# Patient Record
Sex: Male | Born: 2000 | Hispanic: Yes | Marital: Single | Smoking: Never smoker
Health system: Southern US, Community
[De-identification: ages and names within clinical notes are randomized; demographics above are authoritative.]

## PROBLEM LIST (undated history)

## (undated) DIAGNOSIS — R0981 Nasal congestion: Secondary | ICD-10-CM

## (undated) DIAGNOSIS — L989 Disorder of the skin and subcutaneous tissue, unspecified: Secondary | ICD-10-CM

## (undated) DIAGNOSIS — K0889 Other specified disorders of teeth and supporting structures: Secondary | ICD-10-CM

## (undated) DIAGNOSIS — Z8619 Personal history of other infectious and parasitic diseases: Secondary | ICD-10-CM

## (undated) DIAGNOSIS — Z98811 Dental restoration status: Secondary | ICD-10-CM

## (undated) DIAGNOSIS — J302 Other seasonal allergic rhinitis: Secondary | ICD-10-CM

---
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
## 2020-03-27 ENCOUNTER — Other Ambulatory Visit: Payer: Self-pay

## 2020-03-27 DIAGNOSIS — J309 Allergic rhinitis, unspecified: Secondary | ICD-10-CM

## 2020-03-27 DIAGNOSIS — H1010 Acute atopic conjunctivitis, unspecified eye: Secondary | ICD-10-CM

## 2020-03-27 NOTE — Telephone Encounter (Signed)
CALL BACK NUMBER:  [phone]  MEDICATION(S): cetirizine (ZYRTEC) 10 MG tablet and fluticasone (FLONASE) 50 MCG/ACT nasal spray  PREFERRED PHARMACY: [id number] - [place], [place] - [address]  ARE YOU CURRENTLY COMPLETELY OUT OF THE MEDICATION? :  yes

## 2020-03-29 MED ORDER — FLUTICASONE PROPIONATE 50 MCG/ACT NA SUSP: 1.0000 | Freq: Every day | NASAL | 12 refills | Status: DC

## 2020-03-29 MED ORDER — CETIRIZINE HCL 10 MG PO TABS: 10.0000 mg | ORAL_TABLET | Freq: Every day | ORAL | 5 refills | Status: DC

## 2020-07-18 DIAGNOSIS — H538 Other visual disturbances: Secondary | ICD-10-CM | POA: Diagnosis not present

## 2020-07-18 DIAGNOSIS — H52223 Regular astigmatism, bilateral: Secondary | ICD-10-CM | POA: Diagnosis not present

## 2020-07-18 DIAGNOSIS — H5213 Myopia, bilateral: Secondary | ICD-10-CM | POA: Diagnosis not present

## 2020-07-20 ENCOUNTER — Other Ambulatory Visit: Payer: Self-pay

## 2020-07-20 ENCOUNTER — Ambulatory Visit (INDEPENDENT_AMBULATORY_CARE_PROVIDER_SITE_OTHER): Payer: Medicaid Other | Admitting: Student

## 2020-07-20 ENCOUNTER — Encounter: Payer: Self-pay | Admitting: Student

## 2020-07-20 ENCOUNTER — Other Ambulatory Visit (HOSPITAL_COMMUNITY)
Admission: RE | Admit: 2020-07-20 | Discharge: 2020-07-20 | Disposition: A | Discharge status: Home / Self Care | Payer: Medicaid Other | Source: Ambulatory Visit | Attending: Pediatrics | Admitting: Pediatrics

## 2020-07-20 VITALS — BP 110/70 | Ht 64.17 in | Wt 140.0 lb

## 2020-07-20 DIAGNOSIS — Z113 Encounter for screening for infections with a predominantly sexual mode of transmission: Secondary | ICD-10-CM

## 2020-07-20 DIAGNOSIS — Z00121 Encounter for routine child health examination with abnormal findings: Secondary | ICD-10-CM

## 2020-07-20 DIAGNOSIS — Z68.41 Body mass index (BMI) pediatric, 5th percentile to less than 85th percentile for age: Secondary | ICD-10-CM

## 2020-07-20 DIAGNOSIS — L700 Acne vulgaris: Secondary | ICD-10-CM | POA: Diagnosis not present

## 2020-07-20 DIAGNOSIS — Z0101 Encounter for examination of eyes and vision with abnormal findings: Secondary | ICD-10-CM | POA: Diagnosis not present

## 2020-07-20 LAB — POCT RAPID HIV: Rapid HIV, POC: NEGATIVE

## 2020-07-20 NOTE — Progress Notes (Signed)
Adolescent Well Care Visit Dequarius D Montgomery Favor is a 19 y.o. male who is here for well care.    PCP:  Dillon Bjork, MD   History was provided by the patient.  Confidentiality was discussed with the patient and, if applicable, with caregiver as well. Patient's personal or confidential phone number: [phone]  Current Issues: Current concerns include none.   Nutrition: Nutrition/Eating Behaviors: eats a wide variety of meals  Adequate calcium in diet?: has some dairy regularly  Supplements/ Vitamins: no  Exercise/ Media: Play any Sports?/ Exercise: does not play for a team at the moment; plays soccer regularly with brother Screen Time:  > 2 hours  Sleep:  Sleep: ~8 hours at night; sleeps through the night  Social Screening: Lives with: mom, dad brother 8 Parental relations:  good Activities, Work, and Research officer, political party?: not currently working but does chores  Concerns regarding behavior with peers ?  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  no Secondhand smoke exposure?  no Drugs/ETOH?  no  Sexually Active?  no   Pregnancy Prevention: n/a  Safe at home, in school & in relationships?  Yes Safe to self?  Yes   Screenings: Patient has a dental home: yes  The patient completed the Rapid Assessment for Adolescent Preventive Services screening questionnaire and the following topics were identified as risk factors and discussed: exercise  In addition, the following topics were discussed as part of anticipatory guidance exercise.  PHQ-9 completed and results indicated no concerns  Physical Exam:  Vitals:   07/20/20 1543  BP: 110/70  Weight: 140 lb (63.5 kg)  Height: 5' 4.17" (1.63 m)   BP 110/70   Ht 5' 4.17" (1.63 m)   Wt 140 lb (63.5 kg)   BMI 23.90 kg/m  Body mass index: body mass index is 23.9 kg/m. Blood pressure percentiles are not available for patients who are  18 years or older.   Hearing Screening   125Hz  250Hz  500Hz  1000Hz  2000Hz  3000Hz  4000Hz  6000Hz  8000Hz   Right ear:   20 20 20  20     Left ear:   20 20 20  20       Visual Acuity Screening   Right eye Left eye Both eyes  Without correction: 20/50 20/50 20/50   With correction:     Comments: Went to eye doctor two days ago   General Appearance:   alert, oriented, no acute distress; well-appearing and pleasant   HENT: Normocephalic, no obvious abnormality, conjunctiva clear  Mouth:   Normal appearing teeth, no obvious discoloration, dental caries, or dental caps  Neck:   Supple; thyroid: no enlargement, symmetric, no tenderness/mass/nodules  Chest Normal male  Lungs:   Clear to auscultation bilaterally, normal work of breathing  Heart:   Regular rate and rhythm, S1 and S2 normal, no murmurs;   Abdomen:   Soft, non-tender, no mass, or organomegaly  Musculoskeletal:   Tone and strength strong and symmetrical, all extremities    Lymphatic:   No cervical adenopathy  Skin/Hair/Nails:   Skin warm, dry and intact; facial acne with deep scaring  Neurologic:   Strength, gait, and coordination normal and age-appropriate    Assessment and Plan:   Sadarius D Tobby Fawcett is healthy 19 y.o. male here for routine check up.  1. Encounter for well adolescent visit with abnormal findings - Hearing screening result:normal - Vision screening result: abnormal- being followed by eye  doctor  - Counseling provided for all of the vaccine components- UTD - Discussed transitioning care to internal/family med  2. Body mass index, pediatric, 5th percentile to less than 85th percentile for age - BMI is appropriate for age  66. Routine screening for STI (sexually transmitted infection) - POCT Rapid HIV - Urine cytology ancillary only  4. Acne vulgaris - Endorses compliance with acne routine: face washing & differin etc. - Hx of allergy to clinda - Discussed methods of proper skin care at length.  - Derm  referral for scarring & further management  - Ambulatory referral to Dermatology  Orders Placed This Encounter  Procedures  . Ambulatory referral to Dermatology  . POCT Rapid HIV   Return in 1 year for 19yo Antoine.  Ilina Xu, DO

## 2020-07-20 NOTE — Patient Instructions (Signed)

## 2020-07-24 DIAGNOSIS — L700 Acne vulgaris: Secondary | ICD-10-CM | POA: Insufficient documentation

## 2020-07-24 LAB — URINE CYTOLOGY ANCILLARY ONLY
Chlamydia: NEGATIVE
Comment: NEGATIVE
Comment: NORMAL
Neisseria Gonorrhea: NEGATIVE

## 2020-11-10 DIAGNOSIS — L700 Acne vulgaris: Secondary | ICD-10-CM | POA: Diagnosis not present

## 2020-11-10 DIAGNOSIS — Z5181 Encounter for therapeutic drug level monitoring: Secondary | ICD-10-CM | POA: Diagnosis not present

## 2020-12-05 DIAGNOSIS — L700 Acne vulgaris: Secondary | ICD-10-CM | POA: Diagnosis not present

## 2020-12-11 DIAGNOSIS — L700 Acne vulgaris: Secondary | ICD-10-CM | POA: Diagnosis not present

## 2020-12-11 DIAGNOSIS — Z5181 Encounter for therapeutic drug level monitoring: Secondary | ICD-10-CM | POA: Diagnosis not present

## 2021-02-16 DIAGNOSIS — L810 Postinflammatory hyperpigmentation: Secondary | ICD-10-CM | POA: Diagnosis not present

## 2021-02-16 DIAGNOSIS — Z5181 Encounter for therapeutic drug level monitoring: Secondary | ICD-10-CM | POA: Diagnosis not present

## 2021-02-16 DIAGNOSIS — L700 Acne vulgaris: Secondary | ICD-10-CM | POA: Diagnosis not present

## 2021-02-16 DIAGNOSIS — Z79899 Other long term (current) drug therapy: Secondary | ICD-10-CM | POA: Diagnosis not present

## 2021-03-15 ENCOUNTER — Encounter: Payer: Self-pay | Admitting: Pediatrics

## 2021-03-15 ENCOUNTER — Ambulatory Visit (INDEPENDENT_AMBULATORY_CARE_PROVIDER_SITE_OTHER): Payer: Medicaid Other | Admitting: Pediatrics

## 2021-03-15 ENCOUNTER — Other Ambulatory Visit: Payer: Self-pay

## 2021-03-15 DIAGNOSIS — J309 Allergic rhinitis, unspecified: Secondary | ICD-10-CM

## 2021-03-15 DIAGNOSIS — H1010 Acute atopic conjunctivitis, unspecified eye: Secondary | ICD-10-CM | POA: Diagnosis not present

## 2021-03-15 MED ORDER — MONTELUKAST SODIUM 10 MG PO TABS: 10.0000 mg | ORAL_TABLET | Freq: Every day | ORAL | 11 refills | Status: AC

## 2021-03-15 MED ORDER — FLUTICASONE PROPIONATE 50 MCG/ACT NA SUSP: 1.0000 | Freq: Every day | NASAL | 12 refills | Status: DC

## 2021-03-15 MED ORDER — CETIRIZINE HCL 10 MG PO TABS: 10.0000 mg | ORAL_TABLET | Freq: Every day | ORAL | 5 refills | Status: AC

## 2021-03-15 NOTE — Progress Notes (Signed)
  Subjective:    Clifford Gaines is a 20 y.o. old male here by himself for Follow-up .    HPI history of allergies with the pollen -  Needs refills on the medicines  Has used cetirizine and flonase in the past Wondering if there is something better  Father is taking montelukast and has had good effect  Review of Systems  Constitutional: Negative for activity change, appetite change and fever.  HENT: Negative for sinus pressure and trouble swallowing.   Gastrointestinal: Negative for diarrhea and vomiting.        Objective:    BP 118/70 (BP Location: Right Arm, Patient Position: Sitting)   Pulse 69   Temp (!) 97.2 F (36.2 C) (Temporal)   Ht 5' 3.11" (1.603 m)   Wt 145 lb 12.8 oz (66.1 kg)   SpO2 95%   BMI 25.74 kg/m  Physical Exam Constitutional:      Appearance: Normal appearance.  HENT:     Nose:     Comments: Boggy  Nasal turbinates    Mouth/Throat:     Mouth: Mucous membranes are moist.     Pharynx: Oropharynx is clear.  Cardiovascular:     Rate and Rhythm: Normal rate and regular rhythm.  Pulmonary:     Effort: Pulmonary effort is normal.     Breath sounds: Normal breath sounds.  Neurological:     Mental Status: He is alert.        Assessment and Plan:     Clifford Gaines was seen today for Follow-up .   Problem List Items Addressed This Visit   None   Visit Diagnoses    Allergic rhinoconjunctivitis       Relevant Medications   fluticasone (FLONASE) 50 MCG/ACT nasal spray   cetirizine (ZYRTEC) 10 MG tablet   montelukast (SINGULAIR) 10 MG tablet     Allergic rhinitis - refilled cetirizine and flonase. Add singulair per request. Use of medications reviewed.   PRN follow up  No follow-ups on file.  Royston Cowper, MD

## 2021-03-23 DIAGNOSIS — L810 Postinflammatory hyperpigmentation: Secondary | ICD-10-CM | POA: Diagnosis not present

## 2021-03-23 DIAGNOSIS — L730 Acne keloid: Secondary | ICD-10-CM | POA: Diagnosis not present

## 2021-03-23 DIAGNOSIS — L702 Acne varioliformis: Secondary | ICD-10-CM | POA: Diagnosis not present

## 2021-03-23 DIAGNOSIS — Z5181 Encounter for therapeutic drug level monitoring: Secondary | ICD-10-CM | POA: Diagnosis not present

## 2021-05-03 DIAGNOSIS — L700 Acne vulgaris: Secondary | ICD-10-CM | POA: Diagnosis not present

## 2021-05-03 DIAGNOSIS — Z5181 Encounter for therapeutic drug level monitoring: Secondary | ICD-10-CM | POA: Diagnosis not present

## 2021-05-07 ENCOUNTER — Encounter: Payer: Self-pay | Admitting: Pediatrics

## 2021-05-07 ENCOUNTER — Ambulatory Visit (INDEPENDENT_AMBULATORY_CARE_PROVIDER_SITE_OTHER): Payer: Medicaid Other | Admitting: Pediatrics

## 2021-05-07 ENCOUNTER — Encounter: Payer: Self-pay | Admitting: *Deleted

## 2021-05-07 VITALS — Temp 97.6°F | Wt 141.8 lb

## 2021-05-07 DIAGNOSIS — R509 Fever, unspecified: Secondary | ICD-10-CM | POA: Diagnosis not present

## 2021-05-07 LAB — POCT RAPID STREP A (OFFICE): Rapid Strep A Screen: NEGATIVE

## 2021-05-08 LAB — SARS-COV-2 RNA,(COVID-19) QUALITATIVE NAAT: SARS CoV2 RNA: NOT DETECTED

## 2021-05-09 LAB — CULTURE, GROUP A STREP
MICRO NUMBER:: 11894295
SPECIMEN QUALITY:: ADEQUATE

## 2021-05-09 NOTE — Progress Notes (Signed)
PCP: Dillon Bjork, MD   Chief Complaint  Patient presents with  . Fever    Tylenol last taken today around 1pm Symptoms started Friday  . Sore Throat  . Nausea  . Vomiting  . Cough  . Generalized Body Aches      Subjective:  HPI:  Clifford Gaines is a 20 y.o. male presenting with a sore throat, body aches, some nausea, cough.  Started 3 days ago. No mono exposures. No strep exposures.  Max T: unsure (did not take actual number)  Voiding: normal, trying to keep hydrated. Normal color of urination No diarrhea. No constipation.   Sick contacts: no   REVIEW OF SYSTEMS:  GENERAL: not toxic appearing ENT: no eye discharge, no external ear pain, no ear canal pain CV: No chest pain/tenderness PULM: no difficulty breathing or increased work of breathing  GI: no vomiting, diarrhea, constipation     Meds: Current Outpatient Medications  Medication Sig Dispense Refill  . ISOtretinoin (ACCUTANE) 40 MG capsule Take by mouth.    Marland Kitchen adapalene (DIFFERIN) 0.1 % cream Apply topically. (Patient not taking: Reported on 05/07/2021)    . cetirizine (ZYRTEC) 10 MG tablet Take 1 tablet (10 mg total) by mouth daily. (Patient not taking: Reported on 05/07/2021) 30 tablet 5  . fluticasone (FLONASE) 50 MCG/ACT nasal spray Place 1 spray into both nostrils daily. 1 spray in each nostril every day (Patient not taking: Reported on 05/07/2021) 16 g 12  . montelukast (SINGULAIR) 10 MG tablet Take 1 tablet (10 mg total) by mouth at bedtime. (Patient not taking: Reported on 05/07/2021) 30 tablet 11  . Olopatadine HCl 0.2 % SOLN Apply 1 drop to eye daily. (Patient not taking: Reported on 05/07/2021) 2.5 mL 5   No current facility-administered medications for this visit.    ALLERGIES:  Allergies  Allergen Reactions  . Clindamycin/Lincomycin     PMH:  Past Medical History:  Diagnosis Date  . Dental crown present   . History of scabies 10/15/2013  . Nasal congestion 12/28/2013   due to allergies,  per mother  . Scalp lesion 12/2013   right parietal scalp  . Seasonal allergies   . Tooth loose 12/28/2013    PSH:  Past Surgical History:  Procedure Laterality Date  . LESION EXCISION Right 12/30/2013   Procedure: EXCISION OF BENIGN RIGHT PARIETAL SCALP LESION;  Surgeon: Jerilynn Mages. Gerald Stabs, MD;  Location: [facility];  Service: Pediatrics;  Laterality: Right;  . resection of scalp lesion Right 12/28/13    Social history: no sick contacts  Family history: Family History  Problem Relation Age of Onset  . Kidney Stones Mother   . Anesthesia problems Mother        itching and "stinging" of skin after anesthesia  . Diabetes Maternal Grandmother   . Hypertension Maternal Grandmother   . Allergic rhinitis Neg Hx   . Angioedema Neg Hx   . Asthma Neg Hx   . Eczema Neg Hx   . Immunodeficiency Neg Hx   . Urticaria Neg Hx      Objective:   Physical Examination:  Temp: 97.6 F (36.4 C) (Temporal) Pulse:   BP:   (Blood pressure percentiles are not available for patients who are 18 years or older.)  Wt: 141 lb 12.8 oz (64.3 kg)  Ht:    BMI: Body mass index is 25.03 kg/m. (81 %ile (Z= 0.86) based on CDC (Boys, 2-20 Years) BMI-for-age based on BMI available as of 03/15/2021 from  contact on 03/15/2021.) GENERAL: Well appearing, no distress HEENT: NCAT, clear sclerae, TMs normal bilaterally, no nasal discharge,tonsillary erythema without exudate, no evidence of uvula deviation NECK: Supple, no cervical LAD LUNGS: EWOB, CTAB, no wheeze, no crackles CARDIO: RRR, normal S1S2 no murmur, well perfused ABDOMEN: Normoactive bowel sounds, soft, ND/NT, no masses or organomegaly EXTREMITIES: Warm and well perfused NEURO: CNII-XII intact SKIN: No rash, ecchymosis or petechiae     Assessment/Plan:   Clifford Gaines is a 20 y.o. old male here for sore throat, likely viral pharyngitis. POC strep negative, will send for culture. Also did send PCR for COVID which was negative. Discussed normal  course of illness and reasons to return which include the following: -inability to manage secretions (drooling) -dehydration (less than half normal number/quantity of urine) -improvement followed by acute worsening  Supportive care including: -Tylenol alternating with ibuprofen at appropriate dose for weight -Recommended ibuprofen with food.  -1 teaspoon honey with warm liquid to coat throat; CANNOT give <1yo.  If persists, would consider testing for mononucleosis but low suspicion at this point.    Follow up: PRN   Alma Friendly, MD  [facility]

## 2021-06-19 ENCOUNTER — Ambulatory Visit: Payer: Medicaid Other | Attending: Critical Care Medicine

## 2021-06-19 DIAGNOSIS — Z20822 Contact with and (suspected) exposure to covid-19: Secondary | ICD-10-CM | POA: Diagnosis not present

## 2021-06-20 LAB — SARS-COV-2, NAA 2 DAY TAT

## 2021-06-20 LAB — NOVEL CORONAVIRUS, NAA: SARS-CoV-2, NAA: DETECTED — AB

## 2021-08-24 ENCOUNTER — Ambulatory Visit
Admission: RE | Admit: 2021-08-24 | Discharge: 2021-08-24 | Disposition: A | Discharge status: Home / Self Care | Payer: Medicaid Other | Source: Ambulatory Visit | Attending: Pediatrics | Admitting: Pediatrics

## 2021-08-24 ENCOUNTER — Other Ambulatory Visit: Payer: Self-pay

## 2021-08-24 ENCOUNTER — Encounter: Payer: Self-pay | Admitting: Pediatrics

## 2021-08-24 ENCOUNTER — Ambulatory Visit (INDEPENDENT_AMBULATORY_CARE_PROVIDER_SITE_OTHER): Payer: Medicaid Other | Admitting: Pediatrics

## 2021-08-24 VITALS — BP 120/68 | Ht 65.0 in | Wt 141.5 lb

## 2021-08-24 DIAGNOSIS — M25571 Pain in right ankle and joints of right foot: Secondary | ICD-10-CM | POA: Diagnosis not present

## 2021-08-24 MED ORDER — IBUPROFEN 200 MG PO TABS: 400.0000 mg | ORAL_TABLET | Freq: Four times a day (QID) | ORAL | 0 refills | Status: AC | PRN

## 2021-08-24 NOTE — Progress Notes (Addendum)
   SUBJECTIVE:   CHIEF COMPLAINT / HPI:   Chief Complaint  Patient presents with   Foot Injury    A WEEK AGO PT JUMPED AND FELL ON FOOT AND PAIN HAS NOT GONE AWAY. PAIN LEVEL TODAY 7-8     Clifford Gaines is a 20 y.o. male here for right ankle pain. Jumped off a concrete platform at a house event where he was playing guitar. Wearing boots with heel when he landing with both feet onto the concrete ground. Denies rolling ankle. Felt like he hit the ground really hard. Pain started an hour later. Has posterior foot pain that does not radiate into his leg. No swelling. States the area is more red than usual. No previous injury to foot. Took nothing for pain. Not ice. Thought the pain was going away but now it is bothering him. Feels better when he is not wearing shoes. Pain 7/10 with walking and 3/10 with rest.    PERTINENT  PMH / PSH: reviewed and updated as appropriate   OBJECTIVE:   BP 120/68   Ht '5\' 5"'$  (1.651 m)   Wt 141 lb 8 oz (64.2 kg)   BMI 23.55 kg/m    GEN: well appearing male in no acute distress  CVS: well perfused  RESP: speaking in full sentences without pause, no respiratory distress  Right ankle: Inspection: No erythema, edema, ecchymosis or bony deformity, no bone pes planus or cavus deformity, transverse and medial arches intact Palpation: mild lateral calcaneal tenderness, no tenderness at base of 5th metatarsal  ROM: Full active and passive range of motion Strength: 5/5 in all directions No ligamentous laxity No pain at the base of the fifth metatarsal Able to ambulate with minimal pain on impact Special Tests: Negative Thompson test  -Neurovascularly intact, no instability noted   ASSESSMENT/PLAN:   Acute right ankle pain Pt is a 20 yo male who jumped from (<4 ft) from concrete platform with right ankle pain x1 week. Calcaneal tenderness on exam. Obtain xray. Discussed OTC pain control, ice, relative rest. Follow up with [facility] if not improved in  1-2 weeks. Pt agrees with plan.      Lyndee Hensen, DO PGY-3, [facility] 08/24/2021

## 2021-08-24 NOTE — Patient Instructions (Addendum)
It was great seeing you today! Your ankle is likely due to inflammation of the muscles and tendons in your heel. We will get an xray today to evaluate for fracture or dislocation.   As discusssed, take 400-600 mg Ibuprofen for pain up to 3 times a day as needed. Follow up with [facility]alie[facility] Ip (see info below) in 1-2 weeks if not improved. Be sure to rest your foot as discussed. Ice for 15 minutes 3-4 times a day.    [facility] Located in: [facility] Address: [address] [place], [place], [place] [address] Phone: [phone]    Take care,   [facility]

## 2021-08-24 NOTE — Assessment & Plan Note (Signed)
Pt is a 20 yo male who jumped from (<4 ft) from concrete platform with right ankle pain x1 week. Calcaneal tenderness on exam. Obtain xray. Discussed OTC pain control, ice, relative rest. Follow up with Ralie[facility] Ip if not improved in 1-2 weeks. Pt agrees with plan.

## 2021-09-15 IMAGING — CR DG ANKLE COMPLETE 3+V*R*
3 series · 3 of 3 positions shown · non-contrast
Comparison: None.

CLINICAL DATA: heel pain, ?5th MT pain after fall

EXAM:
RIGHT ANKLE - COMPLETE 3+ VIEW

[t ankle joint ap right]
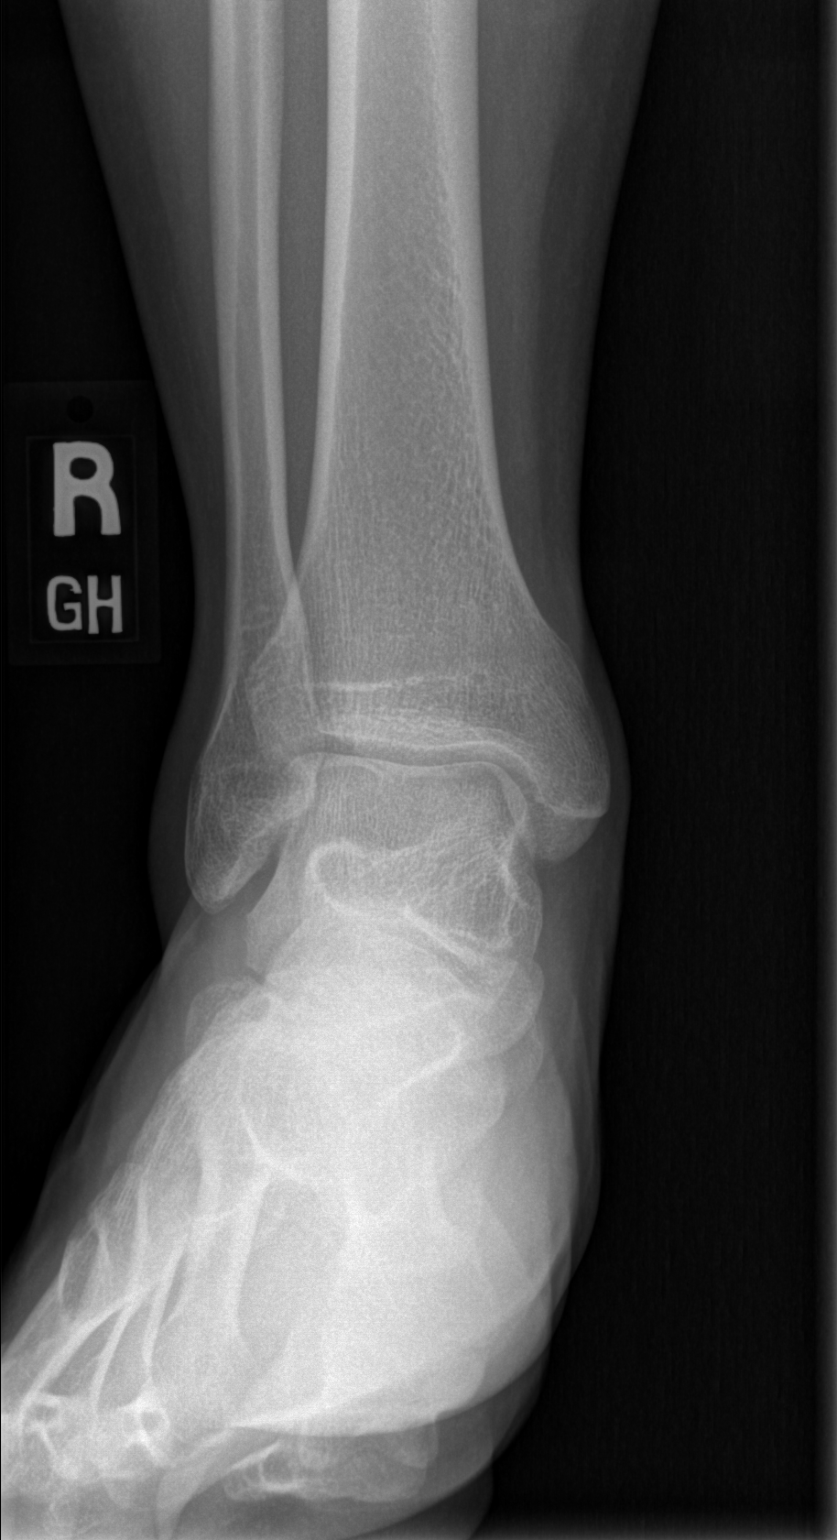

[t ankle joint oblique right]
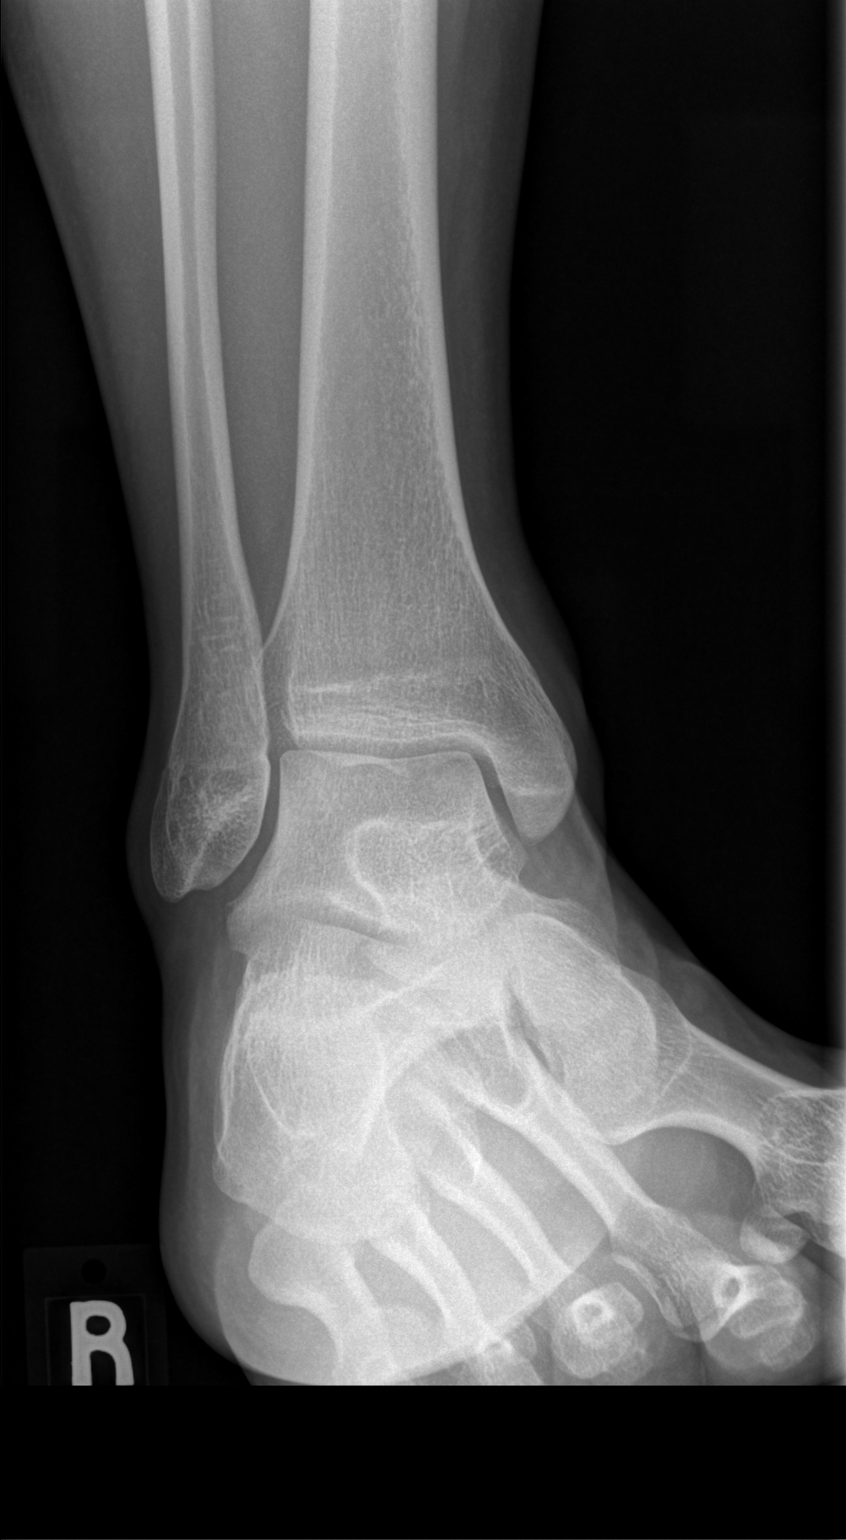

[t ankle joint lat right]
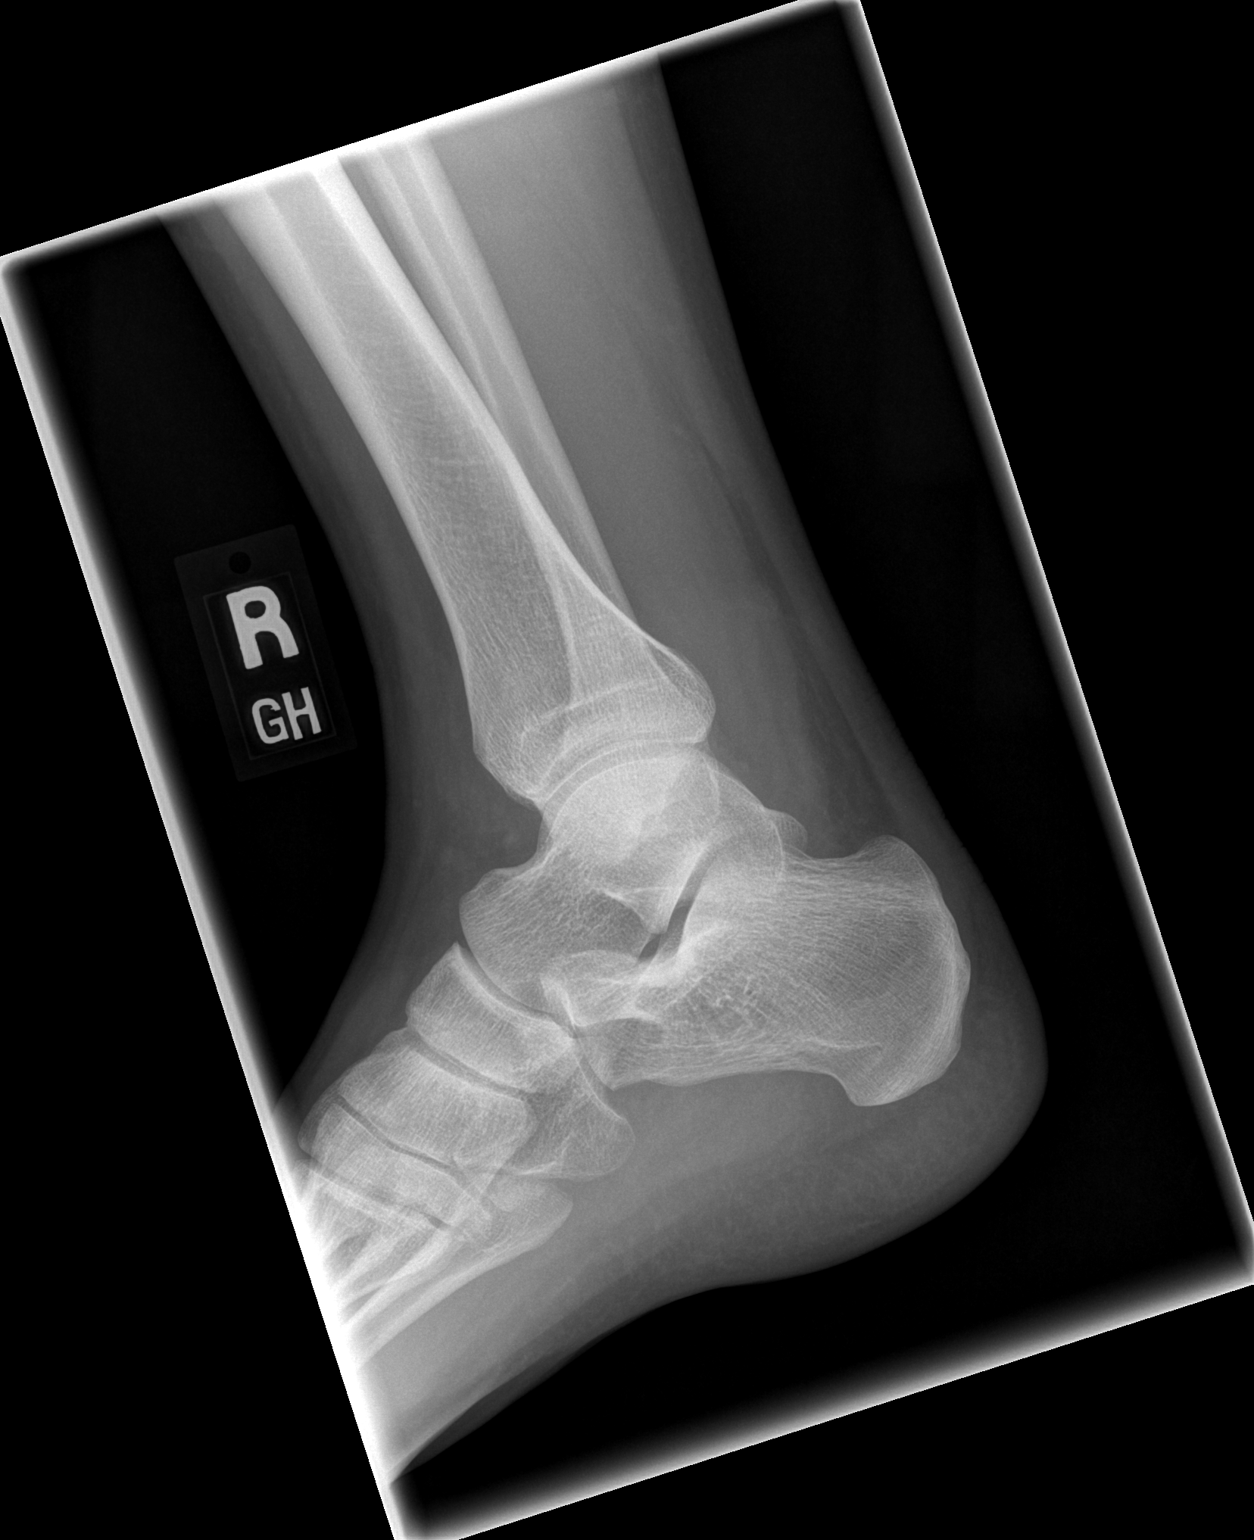

[3 of 3 positions shown; findings below may reference images not displayed]

FINDINGS: There is no evidence of acute fracture or dislocation. There is no
significant arthritis.
IMPRESSION: No acute osseous abnormality.

## 2022-01-21 ENCOUNTER — Ambulatory Visit (HOSPITAL_COMMUNITY)
Admission: EM | Admit: 2022-01-21 | Discharge: 2022-01-21 | Disposition: A | Discharge status: Home / Self Care | Payer: Medicaid Other | Attending: Physician Assistant | Admitting: Physician Assistant

## 2022-01-21 ENCOUNTER — Encounter (HOSPITAL_COMMUNITY): Payer: Self-pay | Admitting: *Deleted

## 2022-01-21 ENCOUNTER — Other Ambulatory Visit: Payer: Self-pay

## 2022-01-21 DIAGNOSIS — J069 Acute upper respiratory infection, unspecified: Secondary | ICD-10-CM | POA: Diagnosis not present

## 2022-01-21 DIAGNOSIS — J309 Allergic rhinitis, unspecified: Secondary | ICD-10-CM

## 2022-01-21 DIAGNOSIS — H1010 Acute atopic conjunctivitis, unspecified eye: Secondary | ICD-10-CM | POA: Diagnosis not present

## 2022-01-21 MED ORDER — FLUTICASONE PROPIONATE 50 MCG/ACT NA SUSP: 1.0000 | Freq: Every day | NASAL | 12 refills | Status: AC

## 2022-01-21 MED ORDER — PROMETHAZINE-DM 6.25-15 MG/5ML PO SYRP: 5.0000 mL | ORAL_SOLUTION | Freq: Four times a day (QID) | ORAL | 0 refills | Status: DC | PRN

## 2022-01-21 NOTE — Discharge Instructions (Signed)
Recommend Mucinex and Flonase Take cough syrup as needed for cough Can continue tylenol as needed Return if symptoms become worse

## 2022-01-21 NOTE — ED Provider Notes (Signed)
[place]    CSN: [id number] Arrival date & time: 01/21/22  1607      History   Chief Complaint Chief Complaint  Patient presents with   Fever   Sore Throat   Cough    HPI Clifford Gaines is a 21 y.o. male.   Pt complains of congestion, cough, fever that started four days ago.  He reports sore throat and postnasal drip as well.  He has been taking mucinex and tylenol with temporary relief.  He denies shortness of breath, wheezing, n/v/d.     Past Medical History:  Diagnosis Date   Dental crown present    History of scabies 10/15/2013   Nasal congestion 12/28/2013   due to allergies, per mother   Scalp lesion 12/2013   right parietal scalp   Seasonal allergies    Tooth loose 12/28/2013    Patient Active Problem List   Diagnosis Date Noted   Acute right ankle pain 08/24/2021   Acne vulgaris 07/24/2020   Influenza B 02/23/2019   Ingrown toenail 07/05/2018   Ingrown toenail of left foot with infection 06/17/2018   Wears glasses 07/05/2016   Skin tag 02/02/2016   Failed vision screen 06/06/2015   Allergic rhinitis 03/30/2015    Past Surgical History:  Procedure Laterality Date   LESION EXCISION Right 12/30/2013   Procedure: EXCISION OF BENIGN RIGHT PARIETAL SCALP LESION;  Surgeon: Jerilynn Mages. Gerald Stabs, MD;  Location: [facility];  Service: Pediatrics;  Laterality: Right;   resection of scalp lesion Right 12/28/13       Home Medications    Prior to Admission medications   Medication Sig Start Date End Date Taking? Authorizing Provider  promethazine-dextromethorphan (PROMETHAZINE-DM) 6.25-15 MG/5ML syrup Take 5 mLs by mouth 4 (four) times daily as needed for cough. 01/21/22  Yes Ward, Lenise Arena, PA-C  adapalene (DIFFERIN) 0.1 % cream Apply topically. Patient not taking: Reported on 05/07/2021 06/27/17   [provider]  cetirizine (ZYRTEC) 10 MG tablet Take 1 tablet (10 mg total) by mouth daily. Patient not taking: Reported on  05/07/2021 03/15/21   Dillon Bjork, MD  fluticasone [facility]) 50 MCG/ACT nasal spray Place 1 spray into both nostrils daily. 1 spray in each nostril every day 01/21/22   Ward, Lenise Arena, PA-C  ibuprofen (MOTRIN IB) 200 MG tablet Take 2 tablets (400 mg total) by mouth every 6 (six) hours as needed. 08/24/21   Lyndee Hensen, DO  ISOtretinoin (ACCUTANE) 40 MG capsule Take by mouth. 05/03/21   [provider]  montelukast (SINGULAIR) 10 MG tablet Take 1 tablet (10 mg total) by mouth at bedtime. Patient not taking: Reported on 05/07/2021 03/15/21   Dillon Bjork, MD  Olopatadine HCl 0.2 % SOLN Apply 1 drop to eye daily. Patient not taking: Reported on 05/07/2021 03/29/19   Rae Lips, MD    Family History Family History  Problem Relation Age of Onset   Kidney Stones Mother    Anesthesia problems Mother        itching and "stinging" of skin after anesthesia   Diabetes Maternal Grandmother    Hypertension Maternal Grandmother    Allergic rhinitis Neg Hx    Angioedema Neg Hx    Asthma Neg Hx    Eczema Neg Hx    Immunodeficiency Neg Hx    Urticaria Neg Hx     Social History Social History   Tobacco Use   Smoking status: Never   Smokeless tobacco: Never  Substance Use  Topics   Alcohol use: No    Alcohol/week: 0.0 standard drinks   Drug use: No     Allergies   Clindamycin/lincomycin   Review of Systems Review of Systems  Constitutional:  Positive for fever. Negative for chills.  HENT:  Positive for congestion and sore throat. Negative for ear pain.   Eyes:  Negative for pain and visual disturbance.  Respiratory:  Positive for cough. Negative for shortness of breath and wheezing.   Cardiovascular:  Negative for chest pain and palpitations.  Gastrointestinal:  Negative for abdominal pain and vomiting.  Genitourinary:  Negative for dysuria and hematuria.  Musculoskeletal:  Negative for arthralgias and back pain.  Skin:  Negative for color change and rash.   Neurological:  Negative for seizures and syncope.  All other systems reviewed and are negative.   Physical Exam Triage Vital Signs ED Triage Vitals  Enc Vitals Group     BP 01/21/22 1738 117/78     Pulse Rate 01/21/22 1738 83     Resp 01/21/22 1738 18     Temp 01/21/22 1738 98.3 F (36.8 C)     Temp src --      SpO2 01/21/22 1738 99 %     Weight --      Height --      Head Circumference --      Peak Flow --      Pain Score 01/21/22 1736 7     Pain Loc --      Pain Edu? --      Excl. in [place]? --    No data found.  Updated Vital Signs BP 117/78    Pulse 83    Temp 98.3 F (36.8 C)    Resp 18    SpO2 99%   Visual Acuity Right Eye Distance:   Left Eye Distance:   Bilateral Distance:    Right Eye Near:   Left Eye Near:    Bilateral Near:     Physical Exam Vitals and nursing note reviewed.  Constitutional:      General: He is not in acute distress.    Appearance: He is well-developed.  HENT:     Head: Normocephalic and atraumatic.     Mouth/Throat:     Pharynx: No pharyngeal swelling or posterior oropharyngeal erythema.     Comments: Postnasal drip Eyes:     Conjunctiva/sclera: Conjunctivae normal.  Cardiovascular:     Rate and Rhythm: Normal rate and regular rhythm.     Heart sounds: No murmur heard. Pulmonary:     Effort: Pulmonary effort is normal. No respiratory distress.     Breath sounds: Normal breath sounds.  Abdominal:     Palpations: Abdomen is soft.     Tenderness: There is no abdominal tenderness.  Musculoskeletal:        General: No swelling.     Cervical back: Neck supple.  Skin:    General: Skin is warm and dry.     Capillary Refill: Capillary refill takes less than 2 seconds.  Neurological:     Mental Status: He is alert.  Psychiatric:        Mood and Affect: Mood normal.     UC Treatments / Results  Labs (all labs ordered are listed, but only abnormal results are displayed) Labs Reviewed - No data to  display  EKG   Radiology No results found.  Procedures Procedures (including critical care time)  Medications Ordered in UC Medications - No data to  display  Initial Impression / Assessment and Plan / UC Course  I have reviewed the triage vital signs and the nursing notes.  Pertinent labs & imaging results that were available during my care of the patient were reviewed by me and considered in my medical decision making (see chart for details).     URI. Supportive care discussed.  Overall pt well appearing, vitals wnl, stable for discharge.  Return precautions discussed.  Final Clinical Impressions(s) / UC Diagnoses   Final diagnoses:  Viral upper respiratory tract infection     Discharge Instructions      Recommend Mucinex and Flonase Take cough syrup as needed for cough Can continue tylenol as needed Return if symptoms become worse      ED Prescriptions     Medication Sig Dispense Auth. Provider   fluticasone (FLONASE) 50 MCG/ACT nasal spray Place 1 spray into both nostrils daily. 1 spray in each nostril every day 16 g Ward, Lenise Arena, PA-C   promethazine-dextromethorphan (PROMETHAZINE-DM) 6.25-15 MG/5ML syrup Take 5 mLs by mouth 4 (four) times daily as needed for cough. 118 mL Ward, Lenise Arena, PA-C      PDMP not reviewed this encounter.   Ward, Lenise Arena, PA-C 01/21/22 1754

## 2022-01-21 NOTE — ED Triage Notes (Signed)
Pt reports for 4 days he has had a cough with lots of mucous,fever.

## 2022-07-18 ENCOUNTER — Other Ambulatory Visit: Payer: Self-pay

## 2022-07-29 ENCOUNTER — Ambulatory Visit (INDEPENDENT_AMBULATORY_CARE_PROVIDER_SITE_OTHER): Payer: Medicaid Other | Admitting: Pediatrics

## 2022-07-29 ENCOUNTER — Encounter: Payer: Self-pay | Admitting: Pediatrics

## 2022-07-29 DIAGNOSIS — M545 Low back pain, unspecified: Secondary | ICD-10-CM | POA: Diagnosis not present

## 2022-07-29 NOTE — Progress Notes (Unsigned)
  Subjective:    Clifford Gaines is a 21 y.o. old male here with his {family members:11419} for Back Pain (Feels it most when he is bending over to pick something up, or going from seated to standing. Middle lower back/Taking tylenol, but no relief. 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 Otherwise no falls, or accidents. [place] 2 weeks. ) .    Interpreter present: ***  HPI  He is carrying speakers, might have picked up the speakers wrong.  Since two weeks ago.  No falling, no accidents.      Patient Active Problem List   Diagnosis Date Noted   Acute right ankle pain 08/24/2021   Acne vulgaris 07/24/2020   Influenza B 02/23/2019   Ingrown toenail 07/05/2018   Ingrown toenail of left foot with infection 06/17/2018   Wears glasses 07/05/2016   Skin tag 02/02/2016   Failed vision screen 06/06/2015   Allergic rhinitis 03/30/2015    PE up to date?:***  History and Problem List: Clifford Gaines has Allergic rhinitis; Failed vision screen; Skin tag; Wears glasses; Ingrown toenail of left foot with infection; Ingrown toenail; Influenza B; Acne vulgaris; and Acute right ankle pain on their problem list.  Clifford Gaines  has a past medical history of Dental crown present, History of scabies (10/15/2013), Nasal congestion (12/28/2013), Scalp lesion (12/2013), Seasonal allergies, and Tooth loose (12/28/2013).  Immunizations needed: {NONE DEFAULTED:18576}     Objective:    There were no vitals taken for this visit.   General Appearance:   {PE GENERAL APPEARANCE:22457}  HENT: normocephalic, no obvious abnormality, conjunctiva clear. Left TM ***, Right TM ***  Mouth:   oropharynx moist, palate, tongue and gums normal; teeth ***  Neck:   supple, *** adenopathy  Lungs:   clear to auscultation bilaterally, even air movement . ***wheeze, ***crackles, ***tachypnea  Heart:   regular rate and regular rhythm, S1 and S2 normal, no murmurs   Abdomen:   soft, non-tender, normal bowel  sounds; no mass, or organomegaly  Musculoskeletal:   tone and strength strong and symmetrical, all extremities full range of motion           Skin/Hair/Nails:   skin warm and dry; no bruises, no rashes, no lesions        Assessment and Plan:     Clifford Gaines was seen today for Back Pain (Feels it most when he is bending over to pick something up, or going from seated to standing. Middle lower back/Taking tylenol, but no relief. 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 Otherwise no falls, or accidents. [place] 2 weeks. ) .   Problem List Items Addressed This Visit   None   Expectant management : importance of fluids and maintaining good hydration reviewed. Continue supportive care Return precautions reviewed. ***   No follow-ups on file.  Theodis Sato, MD

## 2022-07-29 NOTE — Patient Instructions (Addendum)
It was a pleasure taking care of you today!   If you have any questions about anything we've discussed today, please reach out to our office.     Dear Hal Neer,  As your medical provider, it is important to me that you continue to receive high-quality primary care services as you transition to adulthood.  After the age of 21, you can no longer be seen at the [facility] for your primary care health services.   Below is a list of adult medicine practices that are currently accepting new patients.  Please reach out to one of these practices to schedule a new patient appointment as soon as possible.  Please be aware that you will not be able to be seen at my office after your 22nd birthday.  Sincerely, Theodis Sato, MD  [facility]    Adult [place] [facility]  Address: [address], [place] [address]  Phone: [phone] Hours: Monday - Friday 9 AM -6 PM  Types of insurance accepted:  Commercial insurance [place] (orange card) [employer] Uninsured  Language services:  Video and phone interpreters available   Ages 69 and older    Adult primary care Onsite pharmacy Integrated behavioral health Financial assistance counseling Walk-in hours for established patients  Financial assistance counseling hours: Tuesdays 2:00PM - 5:00PM  Thursday 8:30AM - 4:30PM  Space is limited, 10 on Tuesday and 20 on Thursday on a first come, first serve basis  Name [place]  Address: [place], [place] [address]  Phone: [phone]  Hours: Monday - Friday 8:30 AM - 5 PM  Types of insurance accepted:  Pharmacist, community Medicaid Medicare Uninsured  Language services:  Video and phone  interpreters available   All ages - newborn to adult   Primary care for all ages (children and adults) Integrated behavioral health Nutritionist Financial assistance counseling   Name [place] on the ground floor of [facility]  Address: [address],  [place]  [address]  Phone: [phone]  Hours: Monday - Friday 8:15 AM - 5 PM  Types of insurance accepted:  Commercial insurance Medicaid Medicare Uninsured  Language services:  Video and phone interpreters available   Ages 75 and older   Adult primary care Nutritionist Certified Diabetes Educator  Integrated behavioral health Financial assistance counseling   Name [facility]  Address: [address] [place], [place] [address]  Phone: [phone]  Hours: Monday - Friday 8:30 AM - 5 PM    Types of insurance accepted:  Pharmacist, community Medicaid Medicare Uninsured  Language services:  Video and phone interpreters available   All ages - newborn to adult   Primary care for all ages (children and adults) Integrated behavioral health Financial assistance counseling

## 2022-08-14 ENCOUNTER — Ambulatory Visit: Payer: Medicaid Other

## 2022-08-23 ENCOUNTER — Ambulatory Visit: Payer: Medicaid Other | Attending: Pediatrics | Admitting: Physical Therapy

## 2022-08-23 DIAGNOSIS — M5459 Other low back pain: Secondary | ICD-10-CM | POA: Insufficient documentation

## 2022-08-23 DIAGNOSIS — M545 Low back pain, unspecified: Secondary | ICD-10-CM | POA: Insufficient documentation

## 2022-08-23 NOTE — Therapy (Signed)
OUTPATIENT PHYSICAL THERAPY THORACOLUMBAR EVALUATION   Patient Name: Clifford Gaines MRN: [id number] DOB:09/13/2001, 21 y.o., male Today's Date: 08/23/2022   PT End of Session - 08/23/22 1101     Visit Number 1    Number of Visits 4    Date for PT Re-Evaluation 09/20/22    Authorization Type MCD healthy blue    PT Start Time 1057    PT Stop Time 1136    PT Time Calculation (min) 39 min    Activity Tolerance Patient tolerated treatment well    Behavior During Therapy [facility] for tasks assessed/performed             Past Medical History:  Diagnosis Date   Dental crown present    History of scabies 10/15/2013   Nasal congestion 12/28/2013   due to allergies, per mother   Scalp lesion 12/2013   right parietal scalp   Seasonal allergies    Tooth loose 12/28/2013   Past Surgical History:  Procedure Laterality Date   LESION EXCISION Right 12/30/2013   Procedure: EXCISION OF BENIGN RIGHT PARIETAL SCALP LESION;  Surgeon: Jerilynn Mages. Gerald Stabs, MD;  Location: [place];  Service: Pediatrics;  Laterality: Right;   resection of scalp lesion Right 12/28/13   Patient Active Problem List   Diagnosis Date Noted   Acute right ankle pain 08/24/2021   Acne vulgaris 07/24/2020   Influenza B 02/23/2019   Ingrown toenail 07/05/2018   Ingrown toenail of left foot with infection 06/17/2018   Wears glasses 07/05/2016   Skin tag 02/02/2016   Failed vision screen 06/06/2015   Allergic rhinitis 03/30/2015    PCP: Dillon Bjork, MD   REFERRING PROVIDER: Theodis Sato MD   REFERRING DIAG: acute low back pain   Rationale for Evaluation and Treatment Rehabilitation  THERAPY DIAG:  Other low back pain  ONSET DATE: 1 month   SUBJECTIVE:                                                                                                                                                                                           SUBJECTIVE STATEMENT: The only thing I can think  of is that 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  About a month ago he felt a sudden onset of pain.  He did not have any locking up of his spine.  At the time, he had to sit and that really made it worse.  The pain has gotten better but its a dull discomfort when sitting or lifting, reaching down. The primary MD gave him some  exercises but he did not do those.  He denies any weakness, sensory changes.   PERTINENT HISTORY:    None relevant    PAIN:  Are you having pain? Yes: NPRS scale: discomfort 1/10 Pain location: bilateral lumbar central  Pain description: discomfort, soreness, awareness Aggravating factors: sitting  Relieving factors: walking, standing    PRECAUTIONS: None  WEIGHT BEARING RESTRICTIONS No  FALLS:  Has patient fallen in last 6 months? 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     OBJECTIVE:   DIAGNOSTIC FINDINGS:  None   PATIENT SURVEYS:  Modified Oswestry 4% (2/50)   SCREENING FOR RED FLAGS: Bowel or bladder incontinence: No Spinal tumors: No Cauda equina syndrome: No Compression fracture: No Abdominal aneurysm: No  COGNITION:  Overall cognitive status: Within functional limits for tasks assessed     SENSATION: WFL  MUSCLE LENGTH: Hamstrings: Right tigher than Lt Hip flexors and quads WFL Good hip ER/IR passively   POSTURE: No Significant postural limitations  PALPATION: TTP centrally L4-L5 and soreness along paraspinals   LUMBAR ROM:   Active  A/PROM  eval  Flexion Knees straight bends forward with fingertips to just below knees PAIN If he bends his knees WNL   Extension WNL pain relief   Right lateral flexion WNL   Left lateral flexion  WNL  Right rotation WNL  Left rotation WNL    (Blank rows = not tested)  LOWER EXTREMITY ROM:    LEs WNL  Active  Right eval Left eval  Hip flexion    Hip extension    Hip abduction    Hip adduction    Hip internal rotation    Hip external rotation    Knee flexion    Knee extension    Ankle dorsiflexion    Ankle plantarflexion    Ankle inversion    Ankle eversion     (Blank rows = not tested)  LOWER EXTREMITY MMT:    MMT Right eval Left eval  Hip flexion 5 5  Hip extension 5 5  Hip abduction    Hip adduction    Hip internal rotation    Hip external rotation    Knee flexion 5 5  Knee extension 5 5  Ankle dorsiflexion 5 5  Ankle plantarflexion    Ankle inversion    Ankle eversion     (Blank rows = not tested)  LUMBAR SPECIAL TESTS:  Straight leg raise test: Negative and Slump test: Negative  FUNCTIONAL TESTS:  SLS WNL Good squat form , min post pelvic tilt end of squat   GAIT: Distance walked: 150 Assistive device utilized: None Level of assistance: Complete [place] Comments: no deviations     TODAY'S TREATMENT  PT eval. HEP given Performed HEP and education     PATIENT EDUCATION:  Education details: POC, HEP, core, lifting , anatomy of spine, spine , seated posture  Person educated: Patient Education method: Explanation, Demonstration, and Handouts Education comprehension: verbalized understanding, verbal cues required, and needs further education   HOME EXERCISE PROGRAM: Access Code: [email or web address] URL: [email or web address] Date: 08/23/2022 Prepared by: Raeford Razor  Exercises - Prone Press Up  - 1-2 x daily - 7 x weekly - 1 sets - 10 reps -  15 hold - Supine 90/90 Abdominal Bracing  - 1 x daily - 7 x weekly - 1 sets - 5 reps - 30 hold - Supine 90/90 Alternating Heel Touches with Posterior Pelvic Tilt  - 1 x daily - 7 x weekly - 2 sets - 10 reps - 5 hold - Supine 90/90 with Leg Extensions  - 1 x daily - 7 x weekly - 2 sets  - 10 reps - 5 hold - Supine Dead Bug with Leg Extension  - 1 x daily - 7 x weekly - 2 sets - 10 reps - 5 hold - Supine Bridge  - 1 x daily - 7 x weekly - 2 sets - 10 reps - 5 hold  ASSESSMENT:  CLINICAL IMPRESSION: Patient is a 21 y.o. male who was seen today for physical therapy evaluation and treatment for lumbar strain .    OBJECTIVE IMPAIRMENTS decreased mobility, pain, and core weakness .   ACTIVITY LIMITATIONS carrying, lifting, bending, and sitting  PARTICIPATION LIMITATIONS: community activity, occupation, and recreation/hobby  PERSONAL FACTORS Age and Profession are also affecting patient's functional outcome.   REHAB POTENTIAL: Excellent  CLINICAL DECISION MAKING: Stable/uncomplicated  EVALUATION COMPLEXITY: Low   GOALS: Goals reviewed with patient? Yes  LONG TERM GOALS: Target date: 09/20/2022  Pt will be able to sit for class, driving > 1 hour without increased discomfort (from baseline) Baseline: pt is uncomfortable, shifts in seat often, especially > 2 hours  Goal status: INITIAL  2.  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  Pt will be I with HEP for core stability and strength without increasing back pain .  Baseline: given on eval  Goal status: INITIAL   PLAN: PT FREQUENCY: 1x/week  PT DURATION: 4 weeks  PLANNED INTERVENTIONS: Therapeutic exercises, Therapeutic activity, Neuromuscular re-education, Balance training, Gait training, Patient/Family education, Self Care, Joint mobilization, Manual therapy, and Re-evaluation.  PLAN FOR NEXT SESSION: check HEP , core. Sled, lifting   Check all possible CPT codes: 97164 - PT Re-evaluation, 97110- Therapeutic Exercise, 97140 - Manual Therapy, 97530 - Therapeutic Activities, and 97535 - Self Care     If treatment provided at initial evaluation, no treatment  charged due to lack of authorization.       Raeford Razor, PT 08/23/22 12:49 PM Phone: [phone] Fax: [phone]  Aniqua Briere, PT 08/23/2022, 12:40 PM

## 2022-09-05 NOTE — Therapy (Unsigned)
OUTPATIENT PHYSICAL THERAPY TREATMENT NOTE   Patient Name: Clifford Gaines MRN: [id number] DOB:[birth year], 21 y.o., male Today's Date: 09/06/2022  PCP: Mal Misty MD REFERRING PROVIDER: Mal Misty, MD   END OF SESSION:   PT End of Session - 09/06/22 1110     Visit Number 2    Number of Visits 4    Date for PT Re-Evaluation 09/20/22    Authorization Type MCD healthy blue    PT Start Time 1108    PT Stop Time 1142    PT Time Calculation (min) 34 min    Activity Tolerance Patient tolerated treatment well    Behavior During Therapy [facility] for tasks assessed/performed             Past Medical History:  Diagnosis Date   Dental crown present    History of scabies 10/15/2013   Nasal congestion 12/28/2013   due to allergies, per mother   Scalp lesion 12/2013   right parietal scalp   Seasonal allergies    Tooth loose 12/28/2013   Past Surgical History:  Procedure Laterality Date   LESION EXCISION Right 12/30/2013   Procedure: EXCISION OF BENIGN RIGHT PARIETAL SCALP LESION;  Surgeon: Jerilynn Mages. Gerald Stabs, MD;  Location: [facility];  Service: Pediatrics;  Laterality: Right;   resection of scalp lesion Right 12/28/13   Patient Active Problem List   Diagnosis Date Noted   Acute right ankle pain 08/24/2021   Acne vulgaris 07/24/2020   Influenza B 02/23/2019   Ingrown toenail 07/05/2018   Ingrown toenail of left foot with infection 06/17/2018   Wears glasses 07/05/2016   Skin tag 02/02/2016   Failed vision screen 06/06/2015   Allergic rhinitis 03/30/2015    REFERRING DIAG: M54.50 (ICD-10-CM) - Acute midline low back pain without sciatica   THERAPY DIAG:  Other low back pain  Rationale for Evaluation and Treatment Rehabilitation  PERTINENT HISTORY: none relevant   PRECAUTIONS: No   SUBJECTIVE: Pain has been better.  I did the exercises about 3 -4 times since beginning PT.  I really haven't had pain at all.   PAIN:  Are you having pain?  No   OBJECTIVE: (objective measures completed at initial evaluation unless otherwise dated) DIAGNOSTIC FINDINGS:  None    PATIENT SURVEYS:  Modified Oswestry 4% (2/50)    SCREENING FOR RED FLAGS: Bowel or bladder incontinence: No Spinal tumors: No Cauda equina syndrome: No Compression fracture: No Abdominal aneurysm: No   COGNITION:           Overall cognitive status: Within functional limits for tasks assessed                          SENSATION: WFL   MUSCLE LENGTH: Hamstrings: Right tigher than Lt Hip flexors and quads WFL Good hip ER/IR passively    POSTURE: No Significant postural limitations   PALPATION: TTP centrally L4-L5 and soreness along paraspinals    LUMBAR ROM:    Active  A/PROM  eval  Flexion Knees straight bends forward with fingertips to just below knees PAIN If he bends his knees WNL   Extension WNL pain relief   Right lateral flexion WNL   Left lateral flexion WNL  Right rotation WNL  Left rotation WNL    (Blank rows = not tested)   LOWER EXTREMITY ROM:    LEs WNL  Active  Right eval Left eval  Hip flexion      Hip  extension      Hip abduction      Hip adduction      Hip internal rotation      Hip external rotation      Knee flexion      Knee extension      Ankle dorsiflexion      Ankle plantarflexion      Ankle inversion      Ankle eversion       (Blank rows = not tested)   LOWER EXTREMITY MMT:     MMT Right eval Left eval  Hip flexion 5 5  Hip extension 5 5  Hip abduction      Hip adduction      Hip internal rotation      Hip external rotation      Knee flexion 5 5  Knee extension 5 5  Ankle dorsiflexion 5 5  Ankle plantarflexion      Ankle inversion      Ankle eversion       (Blank rows = not tested)   LUMBAR SPECIAL TESTS:  Straight leg raise test: Negative and Slump test: Negative   FUNCTIONAL TESTS:  SLS WNL Good squat form , min post pelvic tilt end of squat    GAIT: Distance walked: 150 Assistive  device utilized: None Level of assistance: Complete Independence Comments: no deviations         TODAY'S TREATMENT  PT eval. HEP given Performed HEP and education    [facility] Adult PT Treatment:                                                DATE: 09/06/22  Therapeutic Exercise:  Prone Press Up x 10 x 10  Supine 90/90 Abdominal Bracing  hand/leg pressing for 10 sec x 5  Supine 90/90 Alternating Heel Touches with Posterior Pelvic Tilt   x 10  Supine 90/90 with Leg Extensions  x 10  Supine Dead Bug with Leg Extension  x  10 Supine Bridge  - 1 x daily - 7 x weekly - 2 sets - 10 reps - 5 hold Plank 30 sec no pain increase  Child pose x 3 , laterals and forward  Bird dog  x 10  Squatting 25 lbs 2 x 10 , cued heavily at first , chair improved  45 lbs bar squats 25 lbs KB Dead lifts x 10   Self Care: Best exercises, recommend weight lifting    PATIENT EDUCATION:  Education details: POC, HEP, core, lifting , anatomy of spine, spine , seated posture  Person educated: Patient Education method: Explanation, Demonstration, and Handouts Education comprehension: verbalized understanding, verbal cues required, and needs further education     HOME EXERCISE PROGRAM: Access Code: [email or web address] URL: [email or web address] Date: 08/23/2022 Prepared by: Raeford Razor   ASSESSMENT:   CLINICAL IMPRESSION: Patient has noticed his pain has continued to improve and he no longer needs to attend therapy.  He is able to  Demonstrate independence with HEP and understands progression.  No pain with lifting, squatting.  Given info on lifting and form to reduce reinjury.    OBJECTIVE IMPAIRMENTS decreased mobility, pain, and core weakness .    ACTIVITY LIMITATIONS carrying, lifting, bending, and sitting   PARTICIPATION LIMITATIONS: community activity, occupation, and recreation/hobby   PERSONAL FACTORS Age and Profession are also affecting patient's functional outcome.  REHAB POTENTIAL:  Excellent   CLINICAL DECISION MAKING: Stable/uncomplicated   EVALUATION COMPLEXITY: Low     GOALS: Goals reviewed with patient? Yes   LONG TERM GOALS: Target date: 09/20/2022   Pt will be able to sit for class, driving > 1 hour without increased discomfort (from baseline) Baseline: pt is uncomfortable, shifts in seat often, especially > 2 hours  Goal status: MET   2.  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  Has not done this .    3.  Pt will be I with HEP for core stability and strength without increasing back pain .  Baseline: given on eval  Goal status: MET     PLAN: PT FREQUENCY: 1x/week   PT DURATION: 4 weeks   PLANNED INTERVENTIONS: Therapeutic exercises, Therapeutic activity, Neuromuscular re-education, Balance training, Gait training, Patient/Family education, Self Care, Joint mobilization, Manual therapy, and Re-evaluation.   PLAN FOR NEXT SESSION: Discharge     PAA,JENNIFER, PT 09/06/2022, 11:55 AM

## 2022-09-06 ENCOUNTER — Encounter: Payer: Self-pay | Admitting: Physical Therapy

## 2022-09-06 ENCOUNTER — Ambulatory Visit: Payer: Medicaid Other | Admitting: Physical Therapy

## 2022-09-06 DIAGNOSIS — M5459 Other low back pain: Secondary | ICD-10-CM

## 2022-09-13 ENCOUNTER — Ambulatory Visit: Payer: Medicaid Other | Admitting: Physical Therapy

## 2022-09-20 ENCOUNTER — Ambulatory Visit: Payer: Medicaid Other | Admitting: Physical Therapy

## 2022-10-01 ENCOUNTER — Ambulatory Visit: Payer: Medicaid Other | Admitting: Pediatrics

## 2022-12-08 ENCOUNTER — Encounter (HOSPITAL_COMMUNITY): Payer: Self-pay | Admitting: *Deleted

## 2022-12-08 ENCOUNTER — Ambulatory Visit (HOSPITAL_COMMUNITY)
Admission: EM | Admit: 2022-12-08 | Discharge: 2022-12-08 | Disposition: A | Discharge status: Home / Self Care | Payer: Medicaid Other | Attending: Physician Assistant | Admitting: Physician Assistant

## 2022-12-08 DIAGNOSIS — Z1152 Encounter for screening for COVID-19: Secondary | ICD-10-CM | POA: Insufficient documentation

## 2022-12-08 DIAGNOSIS — J069 Acute upper respiratory infection, unspecified: Secondary | ICD-10-CM | POA: Diagnosis not present

## 2022-12-08 LAB — RESP PANEL BY RT-PCR (FLU A&B, COVID) ARPGX2
Influenza A by PCR: NEGATIVE
Influenza B by PCR: NEGATIVE
SARS Coronavirus 2 by RT PCR: NEGATIVE

## 2022-12-08 LAB — POCT RAPID STREP A, ED / UC: Streptococcus, Group A Screen (Direct): NEGATIVE

## 2022-12-08 MED ORDER — PSEUDOEPH-BROMPHEN-DM 30-2-10 MG/5ML PO SYRP: 5.0000 mL | ORAL_SOLUTION | Freq: Four times a day (QID) | ORAL | 0 refills | Status: AC | PRN

## 2022-12-08 NOTE — ED Triage Notes (Signed)
Pt states he started coughing, congestion, sore throat, fever, body aches and vomiting on 12/02/2022 he has taken Ny Quil, IBU, Tylenol without relief.

## 2022-12-08 NOTE — ED Provider Notes (Signed)
[facility]    CSN: [id number] Arrival date & time: 12/08/22  1247      History   Chief Complaint Chief Complaint  Patient presents with   Cough   Nasal Congestion   Sore Throat   Emesis    HPI Clifford Gaines Clifford Gaines is a 21 y.o. male.   Patient complains of cough congestion, sore throat, body aches that started about 6 days ago.  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  Reports plan to make positive for strep throat.  He reports nausea related to postnasal drip.  Denies abdominal pain, diarrhea, wheezing, shortness of breath.  Reports he has been taking NyQuil Tylenol and ibuprofen with minimal relief.    Past Medical History:  Diagnosis Date   Dental crown present    History of scabies 10/15/2013   Nasal congestion 12/28/2013   due to allergies, per mother   Scalp lesion 12/2013   right parietal scalp   Seasonal allergies    Tooth loose 12/28/2013    Patient Active Problem List   Diagnosis Date Noted   Acute right ankle pain 08/24/2021   Acne vulgaris 07/24/2020   Influenza B 02/23/2019   Ingrown toenail 07/05/2018   Ingrown toenail of left foot with infection 06/17/2018   Wears glasses 07/05/2016   Skin tag 02/02/2016   Failed vision screen 06/06/2015   Allergic rhinitis 03/30/2015    Past Surgical History:  Procedure Laterality Date   LESION EXCISION Right 12/30/2013   Procedure: EXCISION OF BENIGN RIGHT PARIETAL SCALP LESION;  Surgeon: Jerilynn Mages. Gerald Stabs, MD;  Location: [facility];  Service: Pediatrics;  Laterality: Right;   resection of scalp lesion Right 12/28/13       Home Medications    Prior to Admission medications   Medication Sig Start Date End Date Taking? Authorizing Provider  brompheniramine-pseudoephedrine-DM 30-2-10 MG/5ML syrup Take 5 mLs by mouth 4 (four) times daily as needed. 12/08/22  Yes Ward, Lenise Arena, PA-C  adapalene (DIFFERIN) 0.1 % cream  Apply topically. Patient not taking: Reported on 05/07/2021 06/27/17   [provider]  cetirizine (ZYRTEC) 10 MG tablet Take 1 tablet (10 mg total) by mouth daily. 03/15/21   Dillon Bjork, MD  fluticasone (FLONASE) 50 MCG/ACT nasal spray Place 1 spray into both nostrils daily. 1 spray in each nostril every day 01/21/22   Ward, Lenise Arena, PA-C  ibuprofen (MOTRIN IB) 200 MG tablet Take 2 tablets (400 mg total) by mouth every 6 (six) hours as needed. Patient not taking: Reported on 08/23/2022 08/24/21   Lyndee Hensen, DO  ISOtretinoin (ACCUTANE) 40 MG capsule Take by mouth. Patient not taking: Reported on 08/23/2022 05/03/21   [provider]  montelukast (SINGULAIR) 10 MG tablet Take 1 tablet (10 mg total) by mouth at bedtime. Patient not taking: Reported on 05/07/2021 03/15/21   Dillon Bjork, MD  Olopatadine HCl 0.2 % SOLN Apply 1 drop to eye daily. Patient not taking: Reported on 05/07/2021 03/29/19   Rae Lips, MD    Family History Family History  Problem Relation Age of Onset   Kidney Stones Mother    Anesthesia problems Mother        itching and "stinging" of skin after anesthesia   Diabetes Maternal Grandmother    Hypertension Maternal Grandmother    Allergic rhinitis Neg Hx    Angioedema Neg Hx    Asthma Neg Hx  Eczema Neg Hx    Immunodeficiency Neg Hx    Urticaria Neg Hx     Social History Social History   Tobacco Use   Smoking status: Never   Smokeless tobacco: Never  Vaping Use   Vaping Use: Never used  Substance Use Topics   Alcohol use: No    Alcohol/week: 0.0 standard drinks of alcohol   Drug use: No     Allergies   Clindamycin/lincomycin   Review of Systems Review of Systems  Constitutional:  Positive for fever. Negative for chills.  HENT:  Positive for congestion and sore throat. Negative for ear pain.   Eyes:  Negative for pain and visual disturbance.  Respiratory:  Positive for cough. Negative for shortness of breath and wheezing.    Cardiovascular:  Negative for chest pain and palpitations.  Gastrointestinal:  Negative for abdominal pain and vomiting.  Genitourinary:  Negative for dysuria and hematuria.  Musculoskeletal:  Negative for arthralgias and back pain.  Skin:  Negative for color change and rash.  Neurological:  Negative for seizures and syncope.  All other systems reviewed and are negative.    Physical Exam Triage Vital Signs ED Triage Vitals  Enc Vitals Group     BP 12/08/22 1505 131/85     Pulse Rate 12/08/22 1505 98     Resp 12/08/22 1505 20     Temp 12/08/22 1505 99.9 F (37.7 C)     Temp Source 12/08/22 1505 Oral     SpO2 12/08/22 1505 97 %     Weight --      Height --      Head Circumference --      Peak Flow --      Pain Score 12/08/22 1503 8     Pain Loc --      Pain Edu? --      Excl. in [place]? --    No data found.  Updated Vital Signs BP 131/85 (BP Location: Left Arm)   Pulse 98   Temp 99.9 F (37.7 C) (Oral)   Resp 20   SpO2 97%   Visual Acuity Right Eye Distance:   Left Eye Distance:   Bilateral Distance:    Right Eye Near:   Left Eye Near:    Bilateral Near:     Physical Exam Vitals and nursing note reviewed.  Constitutional:      General: He is not in acute distress.    Appearance: He is well-developed.  HENT:     Head: Normocephalic and atraumatic.  Eyes:     Conjunctiva/sclera: Conjunctivae normal.  Cardiovascular:     Rate and Rhythm: Normal rate and regular rhythm.     Heart sounds: No murmur heard. Pulmonary:     Effort: Pulmonary effort is normal. No respiratory distress.     Breath sounds: Normal breath sounds.  Abdominal:     Palpations: Abdomen is soft.     Tenderness: There is no abdominal tenderness.  Musculoskeletal:        General: No swelling.     Cervical back: Neck supple.  Skin:    General: Skin is warm and dry.     Capillary Refill: Capillary refill takes less than 2 seconds.  Neurological:     Mental Status: He is alert.   Psychiatric:        Mood and Affect: Mood normal.      UC Treatments / Results  Labs (all labs ordered are listed, but only abnormal results are displayed)  Labs Reviewed  RESP PANEL BY RT-PCR (FLU A&B, COVID) [id number]  POCT RAPID STREP A, ED / UC    EKG   Radiology No results found.  Procedures Procedures (including critical care time)  Medications Ordered in UC Medications - No data to display  Initial Impression / Assessment and Plan / UC Course  I have reviewed the triage vital signs and the nursing notes.  Pertinent labs & imaging results that were available during my care of the patient were reviewed by me and considered in my medical decision making (see chart for details).    Upper respiratory infection.  COVID/flu test pending.  Lungs are clear, patient in no acute distress, stable for discharge with supportive care.  Cough syrup prescribed.  Return precautions discussed. Final Clinical Impressions(s) / UC Diagnoses   Final diagnoses:  Viral URI with cough     Discharge Instructions      Your strep test was negative.  Will call with COVID/Flu test results.  Recommend Mucinex and Flonase for congestion and postnasal drip. Can take prescribed cough syrup as needed for cough. Continue with Tylenol or ibuprofen as needed for fever and bodyaches. Drink plenty of fluids, rest. Return if your symptoms become worse.     ED Prescriptions     Medication Sig Dispense Auth. Provider   brompheniramine-pseudoephedrine-DM 30-2-10 MG/5ML syrup Take 5 mLs by mouth 4 (four) times daily as needed. 120 mL Ward, Lenise Arena, PA-C      PDMP not reviewed this encounter.   Ward, Lenise Arena, PA-C 12/08/22 1530

## 2022-12-08 NOTE — Discharge Instructions (Addendum)
Your strep test was negative.  Will call with COVID/Flu test results.  Recommend Mucinex and Flonase for congestion and postnasal drip. Can take prescribed cough syrup as needed for cough. Continue with Tylenol or ibuprofen as needed for fever and bodyaches. Drink plenty of fluids, rest. Return if your symptoms become worse.

## 2022-12-13 DIAGNOSIS — J40 Bronchitis, not specified as acute or chronic: Secondary | ICD-10-CM | POA: Diagnosis not present

## 2022-12-23 DIAGNOSIS — H8303 Labyrinthitis, bilateral: Secondary | ICD-10-CM | POA: Diagnosis not present

## 2023-01-23 ENCOUNTER — Emergency Department (HOSPITAL_BASED_OUTPATIENT_CLINIC_OR_DEPARTMENT_OTHER): Payer: Medicaid Other

## 2023-01-23 ENCOUNTER — Emergency Department (HOSPITAL_BASED_OUTPATIENT_CLINIC_OR_DEPARTMENT_OTHER)
Admission: EM | Admit: 2023-01-23 | Discharge: 2023-01-23 | Disposition: A | Discharge status: Home / Self Care | Payer: Medicaid Other | Attending: Emergency Medicine | Admitting: Emergency Medicine

## 2023-01-23 ENCOUNTER — Other Ambulatory Visit: Payer: Self-pay

## 2023-01-23 ENCOUNTER — Emergency Department (HOSPITAL_COMMUNITY): Payer: Medicaid Other

## 2023-01-23 ENCOUNTER — Encounter (HOSPITAL_BASED_OUTPATIENT_CLINIC_OR_DEPARTMENT_OTHER): Payer: Self-pay

## 2023-01-23 DIAGNOSIS — R109 Unspecified abdominal pain: Secondary | ICD-10-CM | POA: Diagnosis present

## 2023-01-23 DIAGNOSIS — N281 Cyst of kidney, acquired: Secondary | ICD-10-CM | POA: Diagnosis not present

## 2023-01-23 DIAGNOSIS — D72829 Elevated white blood cell count, unspecified: Secondary | ICD-10-CM | POA: Diagnosis not present

## 2023-01-23 DIAGNOSIS — K7689 Other specified diseases of liver: Secondary | ICD-10-CM | POA: Insufficient documentation

## 2023-01-23 DIAGNOSIS — N132 Hydronephrosis with renal and ureteral calculous obstruction: Secondary | ICD-10-CM | POA: Diagnosis not present

## 2023-01-23 DIAGNOSIS — R1010 Upper abdominal pain, unspecified: Secondary | ICD-10-CM | POA: Diagnosis not present

## 2023-01-23 DIAGNOSIS — K429 Umbilical hernia without obstruction or gangrene: Secondary | ICD-10-CM | POA: Diagnosis not present

## 2023-01-23 DIAGNOSIS — K760 Fatty (change of) liver, not elsewhere classified: Secondary | ICD-10-CM | POA: Diagnosis not present

## 2023-01-23 DIAGNOSIS — N200 Calculus of kidney: Secondary | ICD-10-CM

## 2023-01-23 LAB — URINALYSIS, ROUTINE W REFLEX MICROSCOPIC
Bilirubin Urine: NEGATIVE
Glucose, UA: NEGATIVE mg/dL
Ketones, ur: NEGATIVE mg/dL
Leukocytes,Ua: NEGATIVE
Nitrite: NEGATIVE
Specific Gravity, Urine: 1.03 (ref 1.005–1.030)
pH: 5.5 (ref 5.0–8.0)

## 2023-01-23 LAB — BASIC METABOLIC PANEL
Anion gap: 12 (ref 5–15)
BUN: 15 mg/dL (ref 6–20)
CO2: 22 mmol/L (ref 22–32)
Calcium: 10.6 mg/dL — ABNORMAL HIGH (ref 8.9–10.3)
Chloride: 105 mmol/L (ref 98–111)
Creatinine, Ser: 1.18 mg/dL (ref 0.61–1.24)
GFR, Estimated: >60 mL/min (ref >60)
Glucose, Bld: 108 mg/dL — ABNORMAL HIGH (ref 70–99)
Potassium: 4.2 mmol/L (ref 3.5–5.1)
Sodium: 139 mmol/L (ref 135–145)

## 2023-01-23 LAB — CBC
HCT: 43.8 % (ref 39.0–52.0)
Hemoglobin: 15.4 g/dL (ref 13.0–17.0)
MCH: 30 pg (ref 26.0–34.0)
MCHC: 35.2 g/dL (ref 30.0–36.0)
MCV: 85.4 fL (ref 80.0–100.0)
Platelets: 254 10*3/uL (ref 150–400)
RBC: 5.13 MIL/uL (ref 4.22–5.81)
RDW: 12.2 % (ref 11.5–15.5)
WBC: 13.4 10*3/uL — ABNORMAL HIGH (ref 4.0–10.5)
nRBC: 0 % (ref 0.0–0.2)

## 2023-01-23 MED ORDER — ONDANSETRON HCL 4 MG/2ML IJ SOLN
4.0000 mg | Freq: Once | INTRAMUSCULAR | Status: AC
  Administered 2023-01-23: 4 mg via INTRAVENOUS
  Filled 2023-01-23: qty 2

## 2023-01-23 MED ORDER — TAMSULOSIN HCL 0.4 MG PO CAPS: 0.4000 mg | ORAL_CAPSULE | Freq: Every day | ORAL | 0 refills | Status: AC

## 2023-01-23 MED ORDER — ONDANSETRON HCL 4 MG PO TABS: 4.0000 mg | ORAL_TABLET | Freq: Four times a day (QID) | ORAL | 0 refills | Status: AC | PRN

## 2023-01-23 MED ORDER — OXYCODONE-ACETAMINOPHEN 5-325 MG PO TABS: 1.0000 | ORAL_TABLET | Freq: Four times a day (QID) | ORAL | 0 refills | Status: AC | PRN

## 2023-01-23 MED ORDER — KETOROLAC TROMETHAMINE 15 MG/ML IJ SOLN
15.0000 mg | Freq: Once | INTRAMUSCULAR | Status: AC
  Administered 2023-01-23: 15 mg via INTRAVENOUS
  Filled 2023-01-23: qty 1

## 2023-01-23 MED ORDER — MORPHINE SULFATE (PF) 2 MG/ML IV SOLN
2.0000 mg | Freq: Once | INTRAVENOUS | Status: AC
  Administered 2023-01-23: 2 mg via INTRAVENOUS
  Filled 2023-01-23: qty 1

## 2023-01-23 NOTE — Discharge Instructions (Addendum)
You were seen in the emergency department and found to have a kidney stone.  Please take ibuprofen as needed for pain.  We are sending you home with multiple medications to assist with passing the stone and for residual pain/nausea:  -Flomax-this is a medication to help pass the stone, it allows urine to exit the body more freely.  Please take this once daily with a meal.  -Percocet-this is a narcotic/controlled substance medication that has potential addicting qualities.  We recommend that you take 1-2 tablets every 6 hours as needed for severe pain.  Do not drive or operate heavy machinery when taking this medicine as it can be sedating. Do not drink alcohol or take other sedating medications when taking this medicine for safety reasons.  Keep this out of reach of small children.  Please be aware this medicine has Tylenol in it (325 mg/tab) do not exceed the maximum dose of Tylenol in a day per over the counter recommendations should you decide to supplement with Tylenol over the counter.   -Zofran-this is an antinausea medication, you may take this every 8 hours as needed for nausea and vomiting, please allow the tablet to dissolve underneath of your tongue.   We have prescribed you new medication(s) today. Discuss the medications prescribed today with your pharmacist as they can have adverse effects and interactions with your other medicines including over the counter and prescribed medications. Seek medical evaluation if you start to experience new or abnormal symptoms after taking one of these medicines, seek care immediately if you start to experience difficulty breathing, feeling of your throat closing, facial swelling, or rash as these could be indications of a more serious allergic reaction  Please follow-up with the urology group provided in your discharge instructions within 3 to 5 days.  Return to the ER for new or worsening symptoms including but not limited to worsening pain not controlled  by these medicines, inability to keep fluids down, fever, or any other concerns that you may have.

## 2023-01-23 NOTE — ED Provider Notes (Signed)
[facility] Provider Note   CSN: [id number] Arrival date & time: 01/23/23  1357     History  Chief Complaint  Patient presents with   Flank Pain    Clifford Gaines is a 22 y.o. male with overall noncontributory past medical history who presents with concern for left flank pain that began this morning with radiation towards groin.  Patient reports he went to a student clinic, was told he had blood in his urine.  Patient reports strong family history of kidney stones with mom having had 5 already.  Patient reports some nausea in triage.  He has no significant other complaints other than pain.  He denies any personal previous history of kidney stones.  He denies any diarrhea, constipation, fever, chills.  He denies any penile discharge.   Flank Pain       Home Medications Prior to Admission medications   Medication Sig Start Date End Date Taking? Authorizing Provider  ondansetron (ZOFRAN) 4 MG tablet Take 1 tablet (4 mg total) by mouth every 6 (six) hours as needed for nausea or vomiting. 01/23/23  Yes Lorelee Mclaurin H, PA-C  oxyCODONE-acetaminophen (PERCOCET/ROXICET) 5-325 MG tablet Take 1 tablet by mouth every 6 (six) hours as needed for severe pain. 01/23/23  Yes Patrina Andreas H, PA-C  tamsulosin (FLOMAX) 0.4 MG CAPS capsule Take 1 capsule (0.4 mg total) by mouth daily. 01/23/23  Yes Ashok Sawaya H, PA-C  brompheniramine-pseudoephedrine-DM 30-2-10 MG/5ML syrup Take 5 mLs by mouth 4 (four) times daily as needed. 12/08/22   Ward, Lenise Arena, PA-C  cetirizine (ZYRTEC) 10 MG tablet Take 1 tablet (10 mg total) by mouth daily. 03/15/21   Dillon Bjork, MD  fluticasone (FLONASE) 50 MCG/ACT nasal spray Place 1 spray into both nostrils daily. 1 spray in each nostril every day 01/21/22   Ward, Lenise Arena, PA-C  ibuprofen (MOTRIN IB) 200 MG tablet Take 2 tablets (400 mg total) by mouth every 6 (six) hours as needed. Patient not taking:  Reported on 08/23/2022 08/24/21   Lyndee Hensen, DO  montelukast (SINGULAIR) 10 MG tablet Take 1 tablet (10 mg total) by mouth at bedtime. Patient not taking: Reported on 05/07/2021 03/15/21   Dillon Bjork, MD  Olopatadine HCl 0.2 % SOLN Apply 1 drop to eye daily. Patient not taking: Reported on 05/07/2021 03/29/19   Rae Lips, MD      Allergies    Clindamycin/lincomycin    Review of Systems   Review of Systems  Genitourinary:  Positive for flank pain.  All other systems reviewed and are negative.   Physical Exam Updated Vital Signs BP 133/78   Pulse 87   Temp 98.7 F (37.1 C)   Resp 18   Ht '5\' 4"'$  (1.626 m)   Wt 65.8 kg   SpO2 100%   BMI 24.89 kg/m  Physical Exam Vitals and nursing note reviewed.  Constitutional:      General: He is not in acute distress.    Appearance: Normal appearance.  HENT:     Head: Normocephalic and atraumatic.  Eyes:     General:        Right eye: No discharge.        Left eye: No discharge.  Cardiovascular:     Rate and Rhythm: Normal rate and regular rhythm.     Heart sounds: No murmur heard.    No friction rub. No gallop.  Pulmonary:     Effort: Pulmonary effort is normal.  Breath sounds: Normal breath sounds.  Abdominal:     General: Bowel sounds are normal.     Palpations: Abdomen is soft.     Comments: Patient with tenderness to palpation of the left flank with radiation into the left groin.  He has positive CVA tenderness on the left.  No rebound, rigidity, guarding throughout.  Skin:    General: Skin is warm and dry.     Capillary Refill: Capillary refill takes less than 2 seconds.  Neurological:     Mental Status: He is alert and oriented to person, place, and time.  Psychiatric:        Mood and Affect: Mood normal.        Behavior: Behavior normal.     ED Results / Procedures / Treatments   Labs (all labs ordered are listed, but only abnormal results are displayed) Labs Reviewed  URINALYSIS, ROUTINE W REFLEX  MICROSCOPIC - Abnormal; Notable for the following components:      Result Value   Hgb urine dipstick SMALL (*)    Protein, ur TRACE (*)    Bacteria, UA RARE (*)    All other components within normal limits  BASIC METABOLIC PANEL - Abnormal; Notable for the following components:   Glucose, Bld 108 (*)    Calcium 10.6 (*)    All other components within normal limits  CBC - Abnormal; Notable for the following components:   WBC 13.4 (*)    All other components within normal limits    EKG None  Radiology CT Renal Stone Study  Result Date: 01/23/2023 CLINICAL DATA:  LEFT abdominal and flank pain, blood in urine EXAM: CT ABDOMEN AND PELVIS WITHOUT CONTRAST TECHNIQUE: Multidetector CT imaging of the abdomen and pelvis was performed following the standard protocol without IV contrast. RADIATION DOSE REDUCTION: This exam was performed according to the departmental dose-optimization program which includes automated exposure control, adjustment of the mA and/or kV according to patient size and/or use of iterative reconstruction technique. COMPARISON:  None Available. FINDINGS: Lower chest: Lung bases clear Hepatobiliary: Fatty infiltration of liver. Mass identified inferior RIGHT lobe liver 2.3 x 1.6 cm, higher attenuation than the adjacent hepatic steatosis, 33 HU. Mild focal sparing adjacent to gallbladder fossa. No additional hepatobiliary abnormalities. Pancreas: Normal appearance Spleen: Normal appearance Adrenals/Urinary Tract: Adrenal glands and RIGHT kidney normal appearance. LEFT hydronephrosis and hydroureter secondary to a tiny calculus at the LEFT ureterovesical junction. Mild LEFT perinephric and peripelvic edema. No renal masses. RIGHT ureter decompressed. Remainder of bladder unremarkable. Stomach/Bowel: Normal appendix. Small hiatal hernia. Stomach and bowel loops otherwise normal appearance. Vascular/Lymphatic: Aorta normal caliber.  No adenopathy. Reproductive: Unremarkable prostate gland  and seminal vesicles Other: Tiny umbilical hernia containing fat. No free air or free fluid. Musculoskeletal: Unremarkable IMPRESSION: LEFT hydronephrosis and hydroureter secondary to a tiny calculus at the LEFT ureterovesical junction. Fatty infiltration of liver with focal sparing adjacent to gallbladder fossa. Mass identified inferior RIGHT lobe liver 2.3 x 1.6 cm, higher attenuation than the adjacent hepatic steatosis; this could represent a hemangioma or a complicated cyst; follow-up non emergent ultrasound characterization recommended. Tiny umbilical hernia containing fat. Small hiatal hernia. Electronically Signed   By: Lavonia Dana M.D.   On: 01/23/2023 16:44    Procedures Procedures    Medications Ordered in ED Medications  ketorolac (TORADOL) 15 MG/ML injection 15 mg (has no administration in time range)  ondansetron (ZOFRAN) injection 4 mg (4 mg Intravenous Given 01/23/23 1448)  morphine (PF) 2 MG/ML injection 2  mg (2 mg Intravenous Given 01/23/23 1503)    ED Course/ Medical Decision Making/ A&P                             Medical Decision Making Amount and/or Complexity of Data Reviewed Labs: ordered. Radiology: ordered.  Risk Prescription drug management.   This patient is a 22 y.o. male  who presents to the ED for concern of left-sided flank pain.   Differential diagnoses prior to evaluation: The emergent differential diagnosis includes, but is not limited to,  AAA, renal artery/vein embolism/thrombosis, mesenteric ischemia, pyelonephritis, nephrolithiasis, cystitis, biliary colic, pancreatitis, perforated peptic ulcer, appendicitis, diverticulitis, bowel obstruction, Testicular torsion, Epididymitis . This is not an exhaustive differential.   Past Medical History / Co-morbidities: Overall noncontributory  Additional history: Chart reviewed. Pertinent results include: Family history of kidney stones  Physical Exam: Physical exam performed. The pertinent findings  include: Patient with left-sided flank pain, left CVA tenderness, otherwise stable vital signs, he arrives mildly hypertensive with diastolic 119, but is normotensive, with normal pulse, oxygen saturation, respiratory rate after pain control  Lab Tests/Imaging studies: I personally interpreted labs/imaging and the pertinent results include: Patient with moderate leukocytosis, CBC 13.4, BMP shows a somewhat elevated calcium at 10.6, and UA shows small hemoglobin, no evidence of urinary tract infection.  I independently interpreted CT renal stone study which shows complex cyst versus hemangioma in the liver, with recommendation for future evaluation with ultrasound on a nonemergent basis, tiny stone noted at the UVJ on the left, with some hydronephrosis, I agree with the radiologist interpretation.   Medications: I ordered medication including morphine, Zofran, Toradol, patient improved on reevaluation.  Will discharge on Flomax, Zofran, Percocet, encourage close urologic follow-up.  I have reviewed the patients home medicines and have made adjustments as needed.   Disposition: After consideration of the diagnostic results and the patients response to treatment, I feel that patient's symptoms are consistent with left-sided nephrolithiasis, pain is controlled in the emergency department, will discharge with urologic follow-up, return precautions given, patient understands and agrees with this plan.   emergency department workup does not suggest an emergent condition requiring admission or immediate intervention beyond what has been performed at this time. The plan is: as above. The patient is safe for discharge and has been instructed to return immediately for worsening symptoms, change in symptoms or any other concerns.  Final Clinical Impression(s) / ED Diagnoses Final diagnoses:  Kidney stone  Liver cyst    Rx / DC Orders ED Discharge Orders          Ordered    oxyCODONE-acetaminophen  (PERCOCET/ROXICET) 5-325 MG tablet  Every 6 hours PRN        01/23/23 1711    tamsulosin (FLOMAX) 0.4 MG CAPS capsule  Daily        01/23/23 1711    ondansetron (ZOFRAN) 4 MG tablet  Every 6 hours PRN        01/23/23 1711              Besnik Febus, [place] H, PA-C 01/23/23 1718    Malvin Johns, MD 01/23/23 1730

## 2023-01-23 NOTE — ED Triage Notes (Signed)
Patient arrives to ED POV c/o Left flank pain. Per Pt pain began having sharp pain this morning on Left Flank that would radiate towards the abdomen. Pt states he went to a clinic this morning where he was told he has blood in his urine. Pt is nauseous in triage. No other complaints at this time. Pt A/O x4

## 2023-01-23 NOTE — ED Notes (Signed)
Pt is vomiting at this time. Pt is a 10/10 pain level. Pt states left flank pain that comes around front towards groin. Pt is pale,cool, diaphoretic to touch. Provider was made aware of same.

## 2023-01-23 NOTE — ED Notes (Signed)
Extensive teaching to patient and family on plan. To follow up with urology asap. Medications discussed. Strainer given and monitoring explained. No concerns. DC without incident.

## 2023-10-31 DIAGNOSIS — M542 Cervicalgia: Secondary | ICD-10-CM | POA: Diagnosis not present

## 2024-01-28 DIAGNOSIS — R051 Acute cough: Secondary | ICD-10-CM | POA: Diagnosis not present

## 2024-01-28 DIAGNOSIS — R52 Pain, unspecified: Secondary | ICD-10-CM | POA: Diagnosis not present

## 2024-01-28 DIAGNOSIS — Z20822 Contact with and (suspected) exposure to covid-19: Secondary | ICD-10-CM | POA: Diagnosis not present

## 2024-01-28 DIAGNOSIS — R509 Fever, unspecified: Secondary | ICD-10-CM | POA: Diagnosis not present

## 2024-01-28 DIAGNOSIS — J101 Influenza due to other identified influenza virus with other respiratory manifestations: Secondary | ICD-10-CM | POA: Diagnosis not present

## 2024-12-06 DIAGNOSIS — R21 Rash and other nonspecific skin eruption: Secondary | ICD-10-CM | POA: Diagnosis not present

## 2024-12-06 DIAGNOSIS — B356 Tinea cruris: Secondary | ICD-10-CM | POA: Diagnosis not present
